# Patient Record
Sex: Female | Born: 1985 | Race: White | Hispanic: No | Marital: Single | Smoking: Current every day smoker
Health system: Southern US, Community
[De-identification: ages and names within clinical notes are randomized; demographics above are authoritative.]

## PROBLEM LIST (undated history)

## (undated) DIAGNOSIS — J45909 Unspecified asthma, uncomplicated: Secondary | ICD-10-CM

## (undated) DIAGNOSIS — I219 Acute myocardial infarction, unspecified: Secondary | ICD-10-CM

---
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
## 2011-03-05 ENCOUNTER — Emergency Department (HOSPITAL_COMMUNITY)
Admission: EM | Admit: 2011-03-05 | Discharge: 2011-03-05 | Disposition: A | Discharge status: Home / Self Care | Payer: Medicaid Other | Attending: Emergency Medicine | Admitting: Emergency Medicine

## 2011-03-05 DIAGNOSIS — L0231 Cutaneous abscess of buttock: Secondary | ICD-10-CM | POA: Insufficient documentation

## 2011-03-05 DIAGNOSIS — L03317 Cellulitis of buttock: Secondary | ICD-10-CM | POA: Insufficient documentation

## 2017-10-11 DIAGNOSIS — H1045 Other chronic allergic conjunctivitis: Secondary | ICD-10-CM | POA: Diagnosis not present

## 2018-01-30 DIAGNOSIS — R091 Pleurisy: Secondary | ICD-10-CM | POA: Diagnosis not present

## 2018-01-30 DIAGNOSIS — R0789 Other chest pain: Secondary | ICD-10-CM | POA: Diagnosis not present

## 2018-04-25 ENCOUNTER — Emergency Department (HOSPITAL_COMMUNITY): Payer: Medicaid Other

## 2018-04-25 ENCOUNTER — Emergency Department (HOSPITAL_COMMUNITY)
Admission: EM | Admit: 2018-04-25 | Discharge: 2018-04-25 | Disposition: A | Discharge status: Home / Self Care | Payer: Medicaid Other | Attending: Emergency Medicine | Admitting: Emergency Medicine

## 2018-04-25 ENCOUNTER — Other Ambulatory Visit: Payer: Self-pay

## 2018-04-25 ENCOUNTER — Encounter (HOSPITAL_COMMUNITY): Payer: Self-pay | Admitting: Emergency Medicine

## 2018-04-25 DIAGNOSIS — J45909 Unspecified asthma, uncomplicated: Secondary | ICD-10-CM | POA: Insufficient documentation

## 2018-04-25 DIAGNOSIS — Z79899 Other long term (current) drug therapy: Secondary | ICD-10-CM | POA: Diagnosis not present

## 2018-04-25 DIAGNOSIS — Y939 Activity, unspecified: Secondary | ICD-10-CM | POA: Insufficient documentation

## 2018-04-25 DIAGNOSIS — X58XXXA Exposure to other specified factors, initial encounter: Secondary | ICD-10-CM | POA: Insufficient documentation

## 2018-04-25 DIAGNOSIS — Y999 Unspecified external cause status: Secondary | ICD-10-CM | POA: Insufficient documentation

## 2018-04-25 DIAGNOSIS — F17200 Nicotine dependence, unspecified, uncomplicated: Secondary | ICD-10-CM | POA: Diagnosis not present

## 2018-04-25 DIAGNOSIS — S2242XA Multiple fractures of ribs, left side, initial encounter for closed fracture: Secondary | ICD-10-CM | POA: Insufficient documentation

## 2018-04-25 DIAGNOSIS — Y929 Unspecified place or not applicable: Secondary | ICD-10-CM | POA: Diagnosis not present

## 2018-04-25 DIAGNOSIS — S29001A Unspecified injury of muscle and tendon of front wall of thorax, initial encounter: Secondary | ICD-10-CM | POA: Diagnosis present

## 2018-04-25 DIAGNOSIS — S2242XD Multiple fractures of ribs, left side, subsequent encounter for fracture with routine healing: Secondary | ICD-10-CM | POA: Diagnosis not present

## 2018-04-25 HISTORY — DX: Unspecified asthma, uncomplicated: J45.909

## 2018-04-25 MED ORDER — CYCLOBENZAPRINE HCL 10 MG PO TABS: 10.0000 mg | ORAL_TABLET | Freq: Two times a day (BID) | ORAL | 0 refills | Status: DC | PRN

## 2018-04-25 MED ORDER — HYDROMORPHONE HCL 1 MG/ML IJ SOLN
1.0000 mg | Freq: Once | INTRAMUSCULAR | Status: AC
  Administered 2018-04-25: 1 mg via INTRAMUSCULAR
  Filled 2018-04-25: qty 1

## 2018-04-25 MED ORDER — OXYCODONE-ACETAMINOPHEN 5-325 MG PO TABS: 1.0000 | ORAL_TABLET | ORAL | 0 refills | Status: DC | PRN

## 2018-04-25 NOTE — Discharge Instructions (Addendum)
You have fractured your eighth and ninth ribs left side.  Recommend coughing and deep breathing using a pillow.  You will be sore for several days.  Prescription for pain medicine and muscle relaxer.

## 2018-04-25 NOTE — ED Triage Notes (Signed)
Pt states a friend gave her a "bear hug" on Saturday and she felt a pop in her left ribs. Pt states she coughed last night and again felt a pop in her left ribs.

## 2018-04-25 NOTE — ED Provider Notes (Signed)
[facility] EMERGENCY DEPARTMENT Provider Note   CSN: [id number] Arrival date & time: 04/25/18  2001     History   Chief Complaint Chief Complaint  Patient presents with  . Rib pain    HPI Kathryn Ochoa is a 32 y.o. female.  Status post "bear hug" by a friend several days ago and left inferior lateral rib pain.  Pain is worse with a deep breath.  No substernal chest pain, dyspnea, fever, chills, rusty sputum.  She felt a "pop" last night after bending over at work.  Severity is moderate.     Past Medical History:  Diagnosis Date  . Asthma     There are no active problems to display for this patient.   History reviewed. No pertinent surgical history.   OB History   None      Home Medications    Prior to Admission medications   Medication Sig Start Date End Date Taking? Authorizing Provider  cyclobenzaprine (FLEXERIL) 10 MG tablet Take 1 tablet (10 mg total) by mouth 2 (two) times daily as needed for muscle spasms. 04/25/18   Donnetta Hutching, MD  oxyCODONE-acetaminophen (PERCOCET) 5-325 MG tablet Take 1 tablet by mouth every 4 (four) hours as needed. 04/25/18   Donnetta Hutching, MD    Family History No family history on file.  Social History Social History   Tobacco Use  . Smoking status: Current Every Day Smoker    Packs/day: 1.00  . Smokeless tobacco: Never Used  Substance Use Topics  . Alcohol use: Not Currently  . Drug use: Not Currently     Allergies   Patient has no allergy information on record.   Review of Systems Review of Systems  All other systems reviewed and are negative.    Physical Exam Updated Vital Signs BP (!) 144/94 (BP Location: Right Arm)   Pulse (!) 107   Temp 98 F (36.7 C) (Oral)   Resp 18   Ht 5' (1.524 m)   Wt 72.6 kg   LMP 04/22/2018   SpO2 100%   BMI 31.25 kg/m   Physical Exam  Constitutional: She is oriented to person, place, and time. She appears well-developed and well-nourished.  HENT:  Head:  Normocephalic and atraumatic.  Eyes: Conjunctivae are normal.  Neck: Neck supple.  Cardiovascular: Normal rate and regular rhythm.  Pulmonary/Chest: Effort normal and breath sounds normal.  Tender to palpation left inferior lateral rib cage  Abdominal: Soft. Bowel sounds are normal.  Musculoskeletal: Normal range of motion.  Neurological: She is alert and oriented to person, place, and time.  Skin: Skin is warm and dry.  Psychiatric: She has a normal mood and affect. Her behavior is normal.  Nursing note and vitals reviewed.    ED Treatments / Results  Labs (all labs ordered are listed, but only abnormal results are displayed) Labs Reviewed - No data to display  EKG None  Radiology Dg Ribs Unilateral W/chest Left  Result Date: 04/25/2018 CLINICAL DATA:  Left axillary pain EXAM: LEFT RIBS AND CHEST - 3+ VIEW COMPARISON:  01/30/2018 FINDINGS: Acute mildly displaced left eighth and ninth rib fractures. No pneumothorax. Patchy atelectasis or minimal infiltrate at the left base. Normal heart size. IMPRESSION: Acute mildly displaced left eighth and ninth rib fractures without pneumothorax or pleural effusion. Mild patchy opacity at the left base may reflect atelectasis Electronically Signed   By: Jasmine Pang M.D.   On: 04/25/2018 20:37    Procedures Procedures (including critical care time)  Medications Ordered in ED Medications  HYDROmorphone (DILAUDID) injection 1 mg (1 mg Intramuscular Given 04/25/18 2103)     Initial Impression / Assessment and Plan / ED Course  I have reviewed the triage vital signs and the nursing notes.  Pertinent labs & imaging results that were available during my care of the patient were reviewed by me and considered in my medical decision making (see chart for details).     Plain films of left ribs reveal an eighth and ninth rib fracture; no pneumothorax.  Dilaudid 1 mg intramuscular given in the ED.  Discharge medications Percocet and Flexeril 10  mg  Final Clinical Impressions(s) / ED Diagnoses   Final diagnoses:  Fracture of two ribs with routine healing, left    ED Discharge Orders         Ordered    oxyCODONE-acetaminophen (PERCOCET) 5-325 MG tablet  Every 4 hours PRN     04/25/18 2115    cyclobenzaprine (FLEXERIL) 10 MG tablet  2 times daily PRN     04/25/18 2115           Donnetta Hutchingook, Cecelia Graciano, MD 04/25/18 2129

## 2019-10-21 IMAGING — DX DG RIBS W/ CHEST 3+V*L*
5 series · 5 of 5 positions shown · non-contrast
Comparison: 01/30/2018

CLINICAL DATA: Left axillary pain

EXAM:
LEFT RIBS AND CHEST - 3+ VIEW

[chest pa]
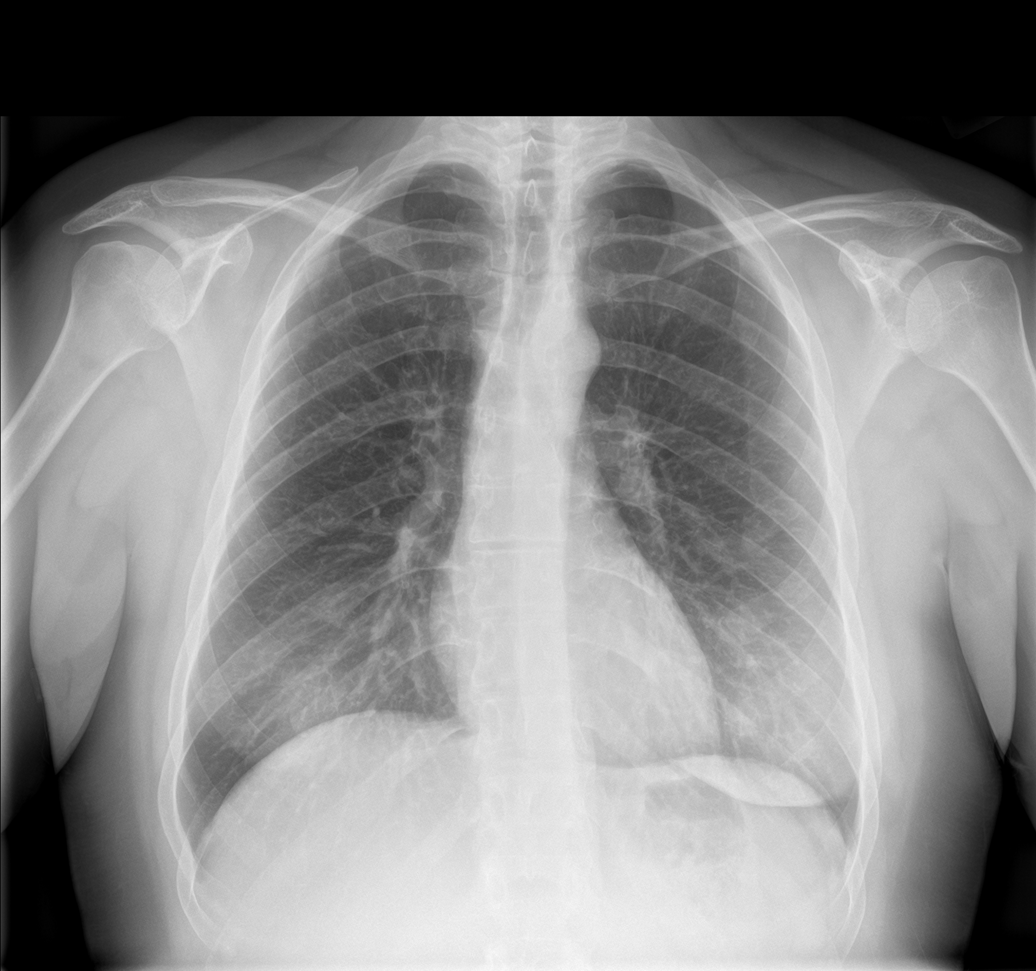

[rib pa obl (1 of 2)]
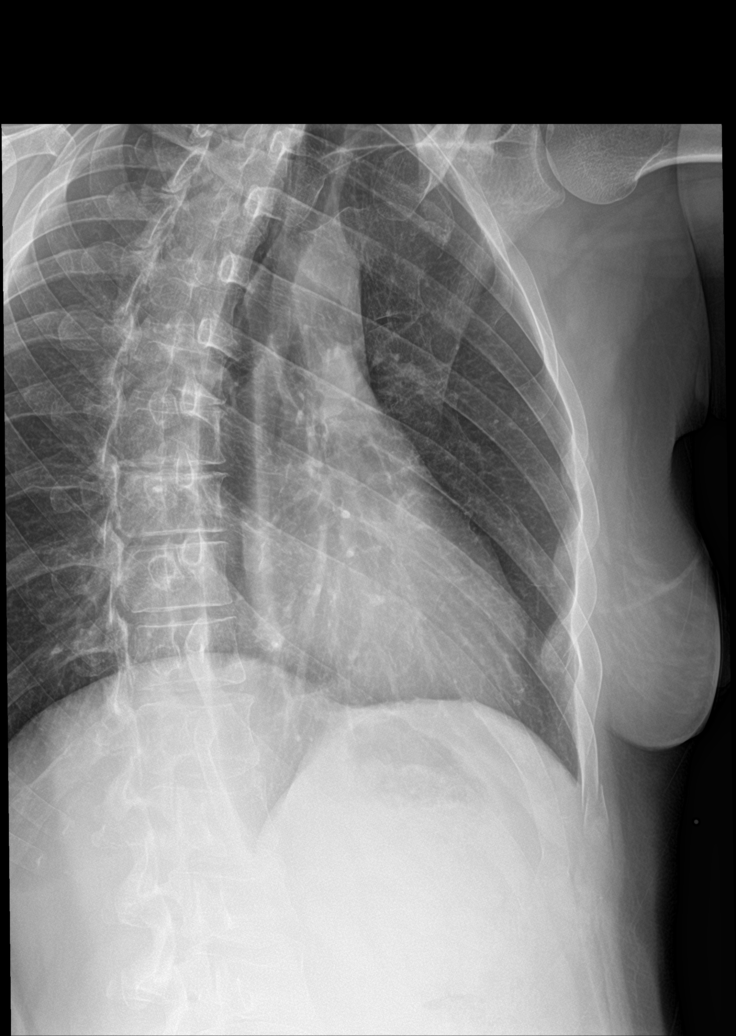

[rib pa obl (2 of 2)]
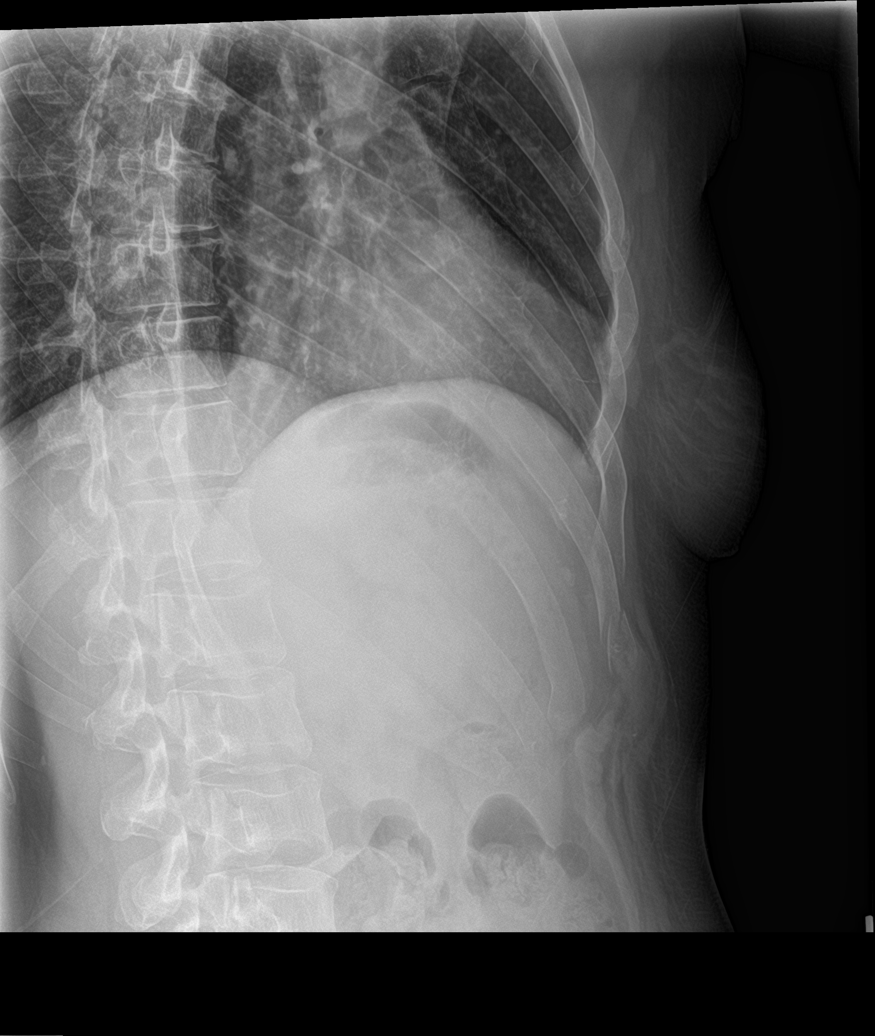

[rib pa (1 of 2)]
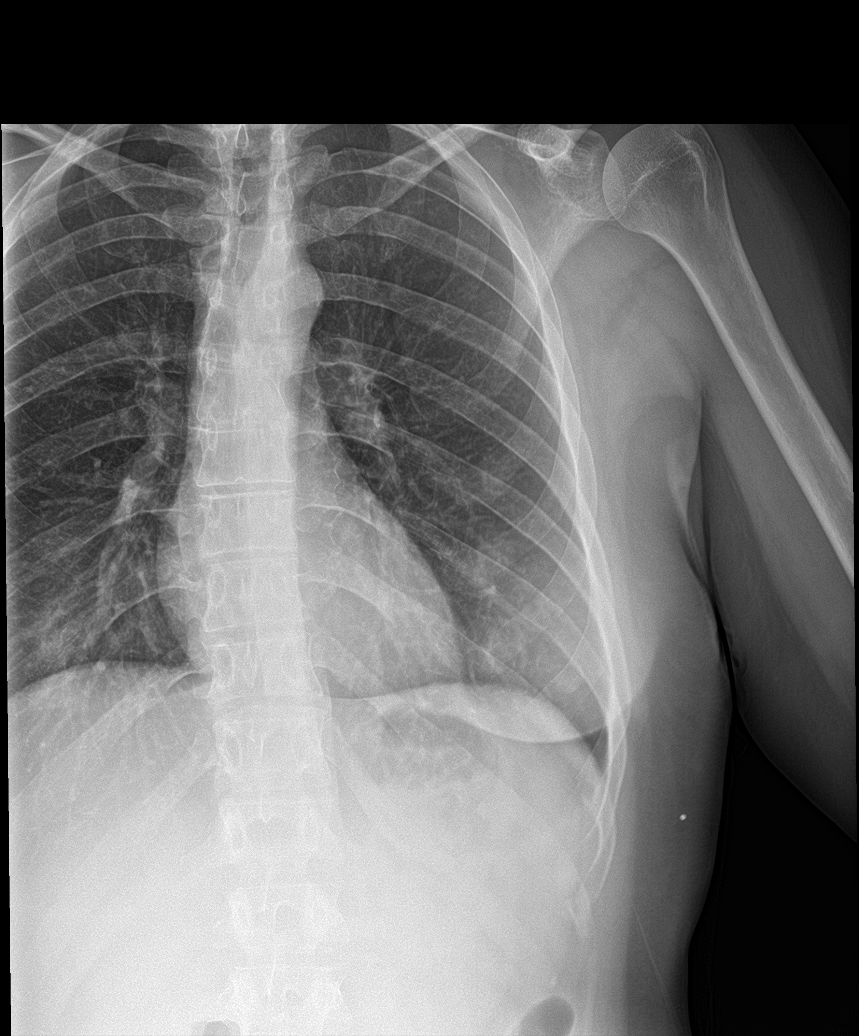

[rib pa (2 of 2)]
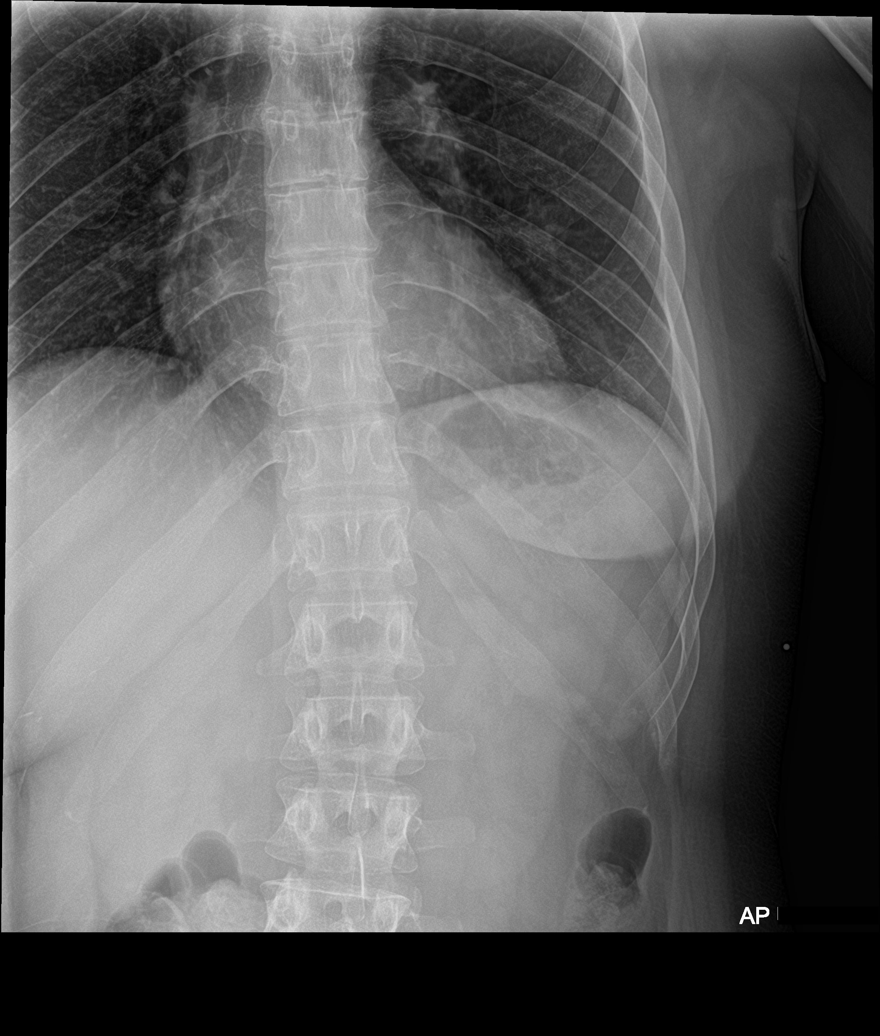

[5 of 5 positions shown; findings below may reference images not displayed]

FINDINGS: Acute mildly displaced left eighth and ninth rib fractures. No
pneumothorax. Patchy atelectasis or minimal infiltrate at the left
base. Normal heart size.
IMPRESSION: Acute mildly displaced left eighth and ninth rib fractures without
pneumothorax or pleural effusion. Mild patchy opacity at the left
base may reflect atelectasis

## 2022-03-23 DIAGNOSIS — Z79899 Other long term (current) drug therapy: Secondary | ICD-10-CM | POA: Diagnosis not present

## 2022-03-28 ENCOUNTER — Emergency Department
Admission: EM | Admit: 2022-03-28 | Discharge: 2022-03-28 | Disposition: A | Discharge status: Home / Self Care | Payer: Medicaid Other | Attending: Emergency Medicine | Admitting: Emergency Medicine

## 2022-03-28 ENCOUNTER — Emergency Department: Payer: Medicaid Other

## 2022-03-28 ENCOUNTER — Encounter: Payer: Self-pay | Admitting: Emergency Medicine

## 2022-03-28 ENCOUNTER — Other Ambulatory Visit: Payer: Self-pay

## 2022-03-28 DIAGNOSIS — R600 Localized edema: Secondary | ICD-10-CM | POA: Insufficient documentation

## 2022-03-28 DIAGNOSIS — R609 Edema, unspecified: Secondary | ICD-10-CM

## 2022-03-28 DIAGNOSIS — R2243 Localized swelling, mass and lump, lower limb, bilateral: Secondary | ICD-10-CM | POA: Insufficient documentation

## 2022-03-28 DIAGNOSIS — J45909 Unspecified asthma, uncomplicated: Secondary | ICD-10-CM | POA: Insufficient documentation

## 2022-03-28 DIAGNOSIS — R9431 Abnormal electrocardiogram [ECG] [EKG]: Secondary | ICD-10-CM | POA: Diagnosis not present

## 2022-03-28 DIAGNOSIS — R0602 Shortness of breath: Secondary | ICD-10-CM | POA: Diagnosis not present

## 2022-03-28 DIAGNOSIS — R079 Chest pain, unspecified: Secondary | ICD-10-CM | POA: Diagnosis not present

## 2022-03-28 DIAGNOSIS — K047 Periapical abscess without sinus: Secondary | ICD-10-CM | POA: Diagnosis not present

## 2022-03-28 HISTORY — DX: Acute myocardial infarction, unspecified: I21.9

## 2022-03-28 LAB — CBC WITH DIFFERENTIAL/PLATELET
Abs Immature Granulocytes: 0.02 10*3/uL (ref 0.00–0.07)
Basophils Absolute: 0.1 10*3/uL (ref 0.0–0.1)
Basophils Relative: 1 %
Eosinophils Absolute: 0.6 10*3/uL — ABNORMAL HIGH (ref 0.0–0.5)
Eosinophils Relative: 8 %
HCT: 35.3 % — ABNORMAL LOW (ref 36.0–46.0)
Hemoglobin: 11.6 g/dL — ABNORMAL LOW (ref 12.0–15.0)
Immature Granulocytes: 0 %
Lymphocytes Relative: 36 %
Lymphs Abs: 2.6 10*3/uL (ref 0.7–4.0)
MCH: 28.4 pg (ref 26.0–34.0)
MCHC: 32.9 g/dL (ref 30.0–36.0)
MCV: 86.3 fL (ref 80.0–100.0)
Monocytes Absolute: 0.6 10*3/uL (ref 0.1–1.0)
Monocytes Relative: 8 %
Neutro Abs: 3.5 10*3/uL (ref 1.7–7.7)
Neutrophils Relative %: 47 %
Platelets: 256 10*3/uL (ref 150–400)
RBC: 4.09 MIL/uL (ref 3.87–5.11)
RDW: 13.3 % (ref 11.5–15.5)
WBC: 7.3 10*3/uL (ref 4.0–10.5)
nRBC: 0 % (ref 0.0–0.2)

## 2022-03-28 LAB — BASIC METABOLIC PANEL
Anion gap: 5 (ref 5–15)
BUN: 7 mg/dL (ref 6–20)
CO2: 26 mmol/L (ref 22–32)
Calcium: 8.6 mg/dL — ABNORMAL LOW (ref 8.9–10.3)
Chloride: 110 mmol/L (ref 98–111)
Creatinine, Ser: 0.83 mg/dL (ref 0.44–1.00)
GFR, Estimated: >60 mL/min (ref >60)
Glucose, Bld: 96 mg/dL (ref 70–99)
Potassium: 4.1 mmol/L (ref 3.5–5.1)
Sodium: 141 mmol/L (ref 135–145)

## 2022-03-28 LAB — URINALYSIS, ROUTINE W REFLEX MICROSCOPIC
Bilirubin Urine: NEGATIVE
Glucose, UA: NEGATIVE mg/dL
Hgb urine dipstick: NEGATIVE
Ketones, ur: NEGATIVE mg/dL
Leukocytes,Ua: NEGATIVE
Nitrite: NEGATIVE
Protein, ur: NEGATIVE mg/dL
Specific Gravity, Urine: 1.008 (ref 1.005–1.030)
pH: 6 (ref 5.0–8.0)

## 2022-03-28 LAB — PREGNANCY, URINE: Preg Test, Ur: NEGATIVE

## 2022-03-28 LAB — BRAIN NATRIURETIC PEPTIDE: B Natriuretic Peptide: 23.7 pg/mL (ref 0.0–100.0)

## 2022-03-28 LAB — TROPONIN I (HIGH SENSITIVITY): Troponin I (High Sensitivity): 2 ng/L (ref <18)

## 2022-03-28 MED ORDER — AMOXICILLIN-POT CLAVULANATE 875-125 MG PO TABS
1.0000 | ORAL_TABLET | Freq: Once | ORAL | Status: AC
  Administered 2022-03-28: 1 via ORAL
  Filled 2022-03-28: qty 1

## 2022-03-28 MED ORDER — AMOXICILLIN-POT CLAVULANATE 875-125 MG PO TABS: 1.0000 | ORAL_TABLET | Freq: Two times a day (BID) | ORAL | 0 refills | Status: AC

## 2022-03-28 NOTE — ED Triage Notes (Signed)
Lower extremitiy edema for couple days and it wont go down.  Says this has happened before, but it went away.

## 2022-03-28 NOTE — Discharge Instructions (Addendum)
Your blood work was all reassuring.  I suspect that your edema in your legs is from being outside on your feet.  Please try to keep your legs elevated whenever you can and wear compression stockings.  I have also prescribed you an antibiotic which you should take twice a day for the next 7 days for your dental abscess.  If you are able to please see a dentist.

## 2022-03-28 NOTE — ED Provider Notes (Signed)
[facility] Provider Note    Event Date/Time   First MD Initiated Contact with Patient 03/28/22 1659     (approximate)   History   Leg Swelling   HPI  Kaden F Kisling is a 36 y.o. female past medical history asthma, prior MI who presents with leg swelling.  Patient notes that her bilateral feet are swollen this morning.  She was having itching and then yesterday and is currently having some pain denies any skin change or injury.  Does feel mildly short of breath denies chest pain fevers chills.  She is currently homeless.  Says she has not been on her feet more than usual but again she is homeless.  Has been sitting a lot.  Also complains of a dental abscess has been going on for several months.  She has a rotten left lower molar which has been an issue for a while.  The other does the abscess seem to rupture spontaneously.    Past Medical History:  Diagnosis Date  . Asthma   . Myocardial infarct (HCC)     There are no problems to display for this patient.    Physical Exam  Triage Vital Signs: ED Triage Vitals  Enc Vitals Group     BP 03/28/22 1518 127/79     Pulse Rate 03/28/22 1518 (!) 102     Resp 03/28/22 1518 16     Temp 03/28/22 1532 98.4 F (36.9 C)     Temp Source 03/28/22 1532 Oral     SpO2 03/28/22 1518 100 %     Weight 03/28/22 1518 170 lb (77.1 kg)     Height 03/28/22 1518 5' (1.524 m)     Head Circumference --      Peak Flow --      Pain Score 03/28/22 1518 7     Pain Loc --      Pain Edu? --      Excl. in GC? --     Most recent vital signs: Vitals:   03/28/22 1518 03/28/22 1532  BP: 127/79   Pulse: (!) 102   Resp: 16   Temp:  98.4 F (36.9 C)  SpO2: 100%      General: Awake, no distress.  CV:  Good peripheral perfusion.  2+ DP pulses bilaterally Resp:  Normal effort.  No increased work of breathing Abd:  No distention.  Neuro:             Awake, Alert, Oriented x 3  Other:  No pitting edema noted of the  bilateral lower extremities, no rashes, no erythema or warmth minimally tender bilaterally compartments are soft Mild swelling along the left mandibular angle, rotten left posterior molar, mild fluctuance along the buccal mucosa   ED Results / Procedures / Treatments  Labs (all labs ordered are listed, but only abnormal results are displayed) Labs Reviewed  BASIC METABOLIC PANEL - Abnormal; Notable for the following components:      Result Value   Calcium 8.6 (*)    All other components within normal limits  CBC WITH DIFFERENTIAL/PLATELET - Abnormal; Notable for the following components:   Hemoglobin 11.6 (*)    HCT 35.3 (*)    Eosinophils Absolute 0.6 (*)    All other components within normal limits  BRAIN NATRIURETIC PEPTIDE  TROPONIN I (HIGH SENSITIVITY)  TROPONIN I (HIGH SENSITIVITY)     EKG  EKG interpretation performed by myself: NSR, nml axis, nml intervals, no acute ischemic changes  RADIOLOGY I reviewed and interpreted the CXR which does not show any acute cardiopulmonary process    PROCEDURES:  Critical Care performed: No  Procedures    MEDICATIONS ORDERED IN ED: Medications  amoxicillin-clavulanate (AUGMENTIN) 875-125 MG per tablet 1 tablet (has no administration in time range)     IMPRESSION / MDM / ASSESSMENT AND PLAN / ED COURSE  I reviewed the triage vital signs and the nursing notes.                              Patient's presentation is most consistent with acute complicated illness / injury requiring diagnostic workup.  Differential diagnosis includes, but is not limited to, peripheral edema secondary to heat exposure, CHF, cirrhosis, renal failure  Patient is a 36 year old female with prior history of MI presenting with edema in the bilateral feet.  She noticed this this morning.  Currently homeless.  Her boyfriend was concerned about possible cellulitis.  She did note that she had itching yesterday but not today.  On exam I really do not  know any peripheral edema there is no skin changes suggest cellulitis and she has good pulses and is neurovascularly intact.  Labs including renal function BNP troponin and CBC are all reassuring.  Chest x-ray does not show any evidence of pulmonary edema.  If there is mild edema suspect could be related to being on her feet because she is currently homeless and being out in the sun with vasodilation from heat.  Recommended elevation and compression.  Patient has a secondary complaint of dental abscess.  She does have some swelling along the left mandibular angle likely due to a infected left posterior molar.  No obvious area for drainage procedure.  Will prescribe Augmentin.  She is otherwise appropriate for discharge.  Clinical Course as of 03/28/22 1857  Tue Mar 28, 2022  1857 Preg Test, Ur: NEGATIVE [KM]    Clinical Course User Index [KM] Georga Hacking, MD     FINAL CLINICAL IMPRESSION(S) / ED DIAGNOSES   Final diagnoses:  Peripheral edema  Dental abscess     Rx / DC Orders   ED Discharge Orders          Ordered    amoxicillin-clavulanate (AUGMENTIN) 875-125 MG tablet  2 times daily        03/28/22 1723             Note:  This document was prepared using Dragon voice recognition software and may include unintentional dictation errors.   Georga Hacking, MD 03/28/22 1729

## 2022-03-28 NOTE — ED Provider Triage Note (Signed)
Emergency Medicine Provider Triage Evaluation Note  Kathryn Ochoa , a 36 y.o. female  was evaluated in triage.  Pt complains of swelling in legs, sob, hx mi 2 years ago.  Review of Systems  Positive: Swelling in legs  Negative: fever  Physical Exam  BP 127/79   Pulse (!) 102   Resp 16   Ht 5' (1.524 m)   Wt 77.1 kg   LMP 02/14/2022 (Approximate)   SpO2 100%   BMI 33.20 kg/m  Gen:   Awake, no distress   Resp:  Normal effort  MSK:   Moves extremities without difficulty , swelling in bothe lower legs Other:    Medical Decision Making  Medically screening exam initiated at 3:25 PM.  Appropriate orders placed.  Latonja F Busey was informed that the remainder of the evaluation will be completed by another provider, this initial triage assessment does not replace that evaluation, and the importance of remaining in the ED until their evaluation is complete.     Faythe Ghee, PA-C 03/28/22 1525
